# Patient Record
Sex: Female | Born: 1945 | Race: White | Hispanic: No | Marital: Married
Health system: Southern US, Community
[De-identification: ages and names within clinical notes are randomized; demographics above are authoritative.]

---

## 2004-07-30 ENCOUNTER — Ambulatory Visit: Payer: Self-pay

## 2004-10-28 ENCOUNTER — Ambulatory Visit: Payer: Self-pay | Admitting: Gastroenterology

## 2004-11-24 ENCOUNTER — Ambulatory Visit: Payer: Self-pay | Admitting: Gastroenterology

## 2004-12-02 ENCOUNTER — Ambulatory Visit: Payer: Self-pay | Admitting: Gastroenterology

## 2005-01-01 ENCOUNTER — Ambulatory Visit: Payer: Self-pay | Admitting: Internal Medicine

## 2005-02-17 ENCOUNTER — Other Ambulatory Visit: Payer: Self-pay

## 2005-02-17 ENCOUNTER — Inpatient Hospital Stay: Payer: Self-pay | Admitting: Specialist

## 2005-02-22 ENCOUNTER — Inpatient Hospital Stay: Payer: Self-pay | Admitting: Specialist

## 2005-03-05 ENCOUNTER — Ambulatory Visit: Payer: Self-pay | Admitting: Internal Medicine

## 2005-05-26 ENCOUNTER — Ambulatory Visit: Payer: Self-pay | Admitting: Internal Medicine

## 2005-08-04 ENCOUNTER — Ambulatory Visit: Payer: Self-pay

## 2005-11-03 ENCOUNTER — Other Ambulatory Visit: Payer: Self-pay

## 2005-11-03 ENCOUNTER — Ambulatory Visit: Payer: Self-pay

## 2005-12-07 ENCOUNTER — Inpatient Hospital Stay (HOSPITAL_COMMUNITY): Admission: RE | Admit: 2005-12-07 | Discharge: 2005-12-10 | Payer: Self-pay | Admitting: Orthopedic Surgery

## 2006-10-05 ENCOUNTER — Ambulatory Visit: Payer: Self-pay

## 2006-10-19 ENCOUNTER — Ambulatory Visit: Payer: Self-pay | Admitting: Unknown Physician Specialty

## 2006-11-17 ENCOUNTER — Ambulatory Visit: Payer: Self-pay | Admitting: Family Medicine

## 2007-04-18 IMAGING — CT CT OF THE LEFT HIP WITHOUT CONTRAST
2 series · 12 of 32 positions shown, 17 images · non-contrast
Comparison: none

REASON FOR EXAM: Fall
COMMENTS:  LMP: N/A

PROCEDURE:     CT  - CT HIP LEFT WITHOUT CONTRAST  - February 17, 2005  [DATE]
RESULT:     Routine CT scan of the LEFT hip was performed without contrast.

[Series 2: hip 3.0 b70s · axial · 0.35mm/px · z∈[-1486,-1396]mm · 4 of 50 slices shown, 9 images]
[im 10/50  soft-tissue]
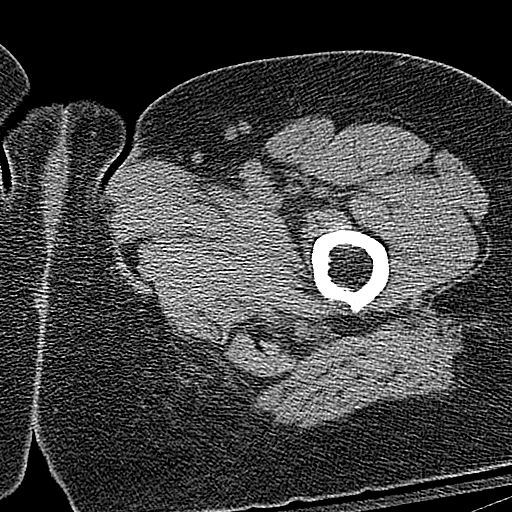
[im 10/50  lung]
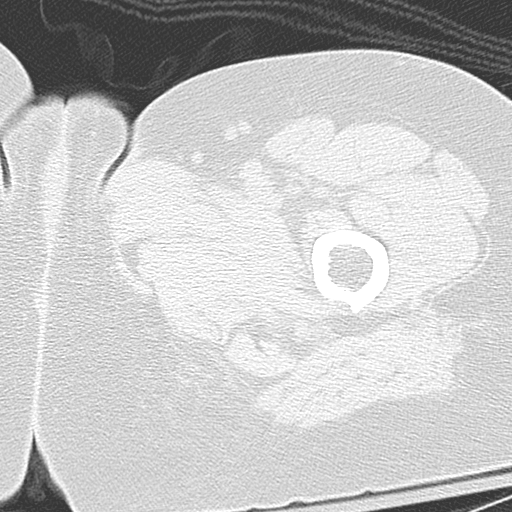
[im 10/50  bone]
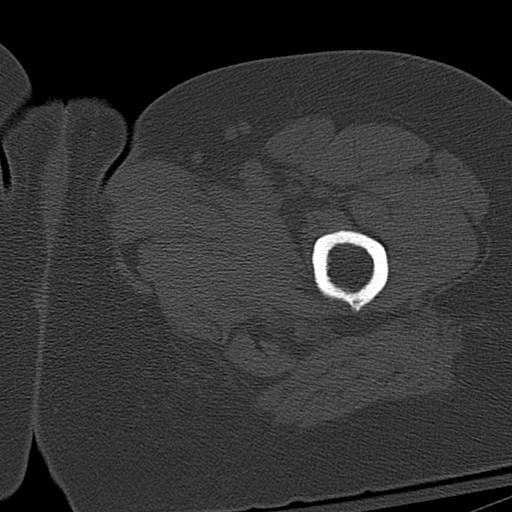
[im 20/50  soft-tissue]
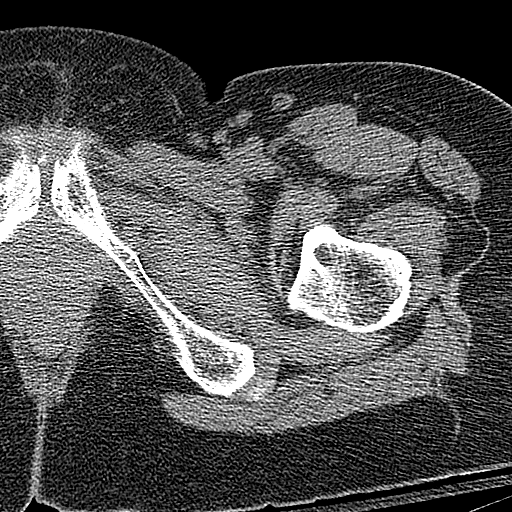
[im 20/50  lung]
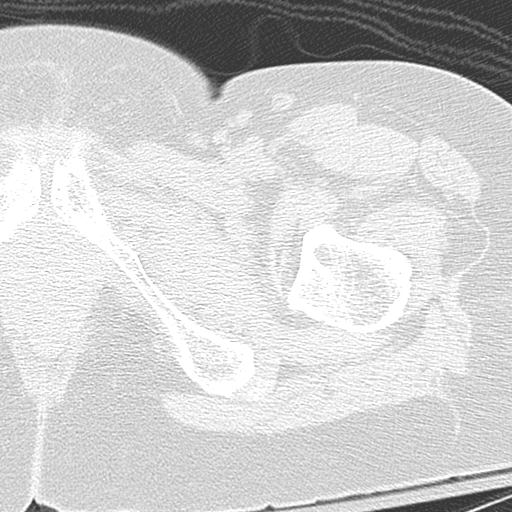
[im 30/50  soft-tissue]
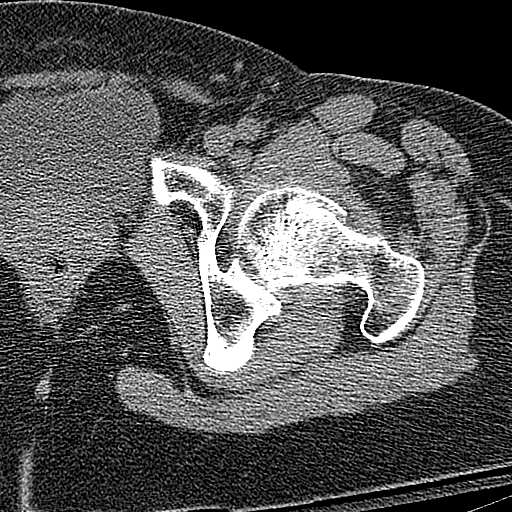
[im 30/50  lung]
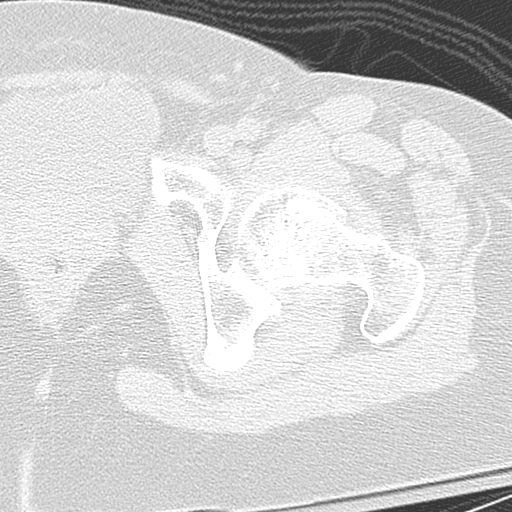
[im 40/50  soft-tissue]
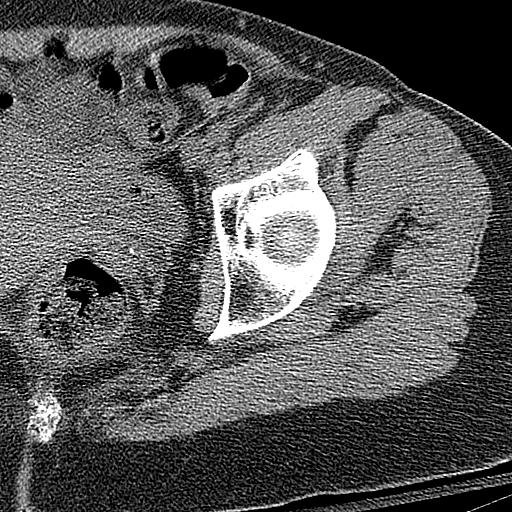
[im 40/50  lung]
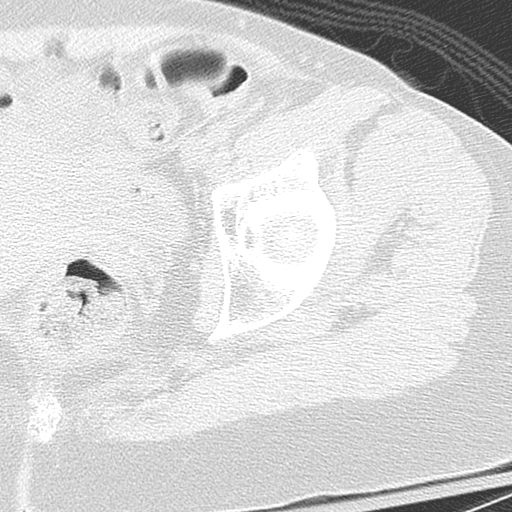

[Series 3: inspace · axial · 0.35mm/px · z∈[-1499,-1374]mm · 8 of 212 slices shown]
[im 17/212  soft-tissue]
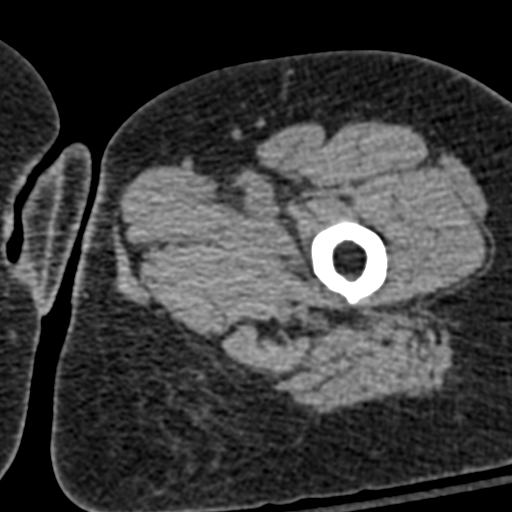
[im 43/212  soft-tissue]
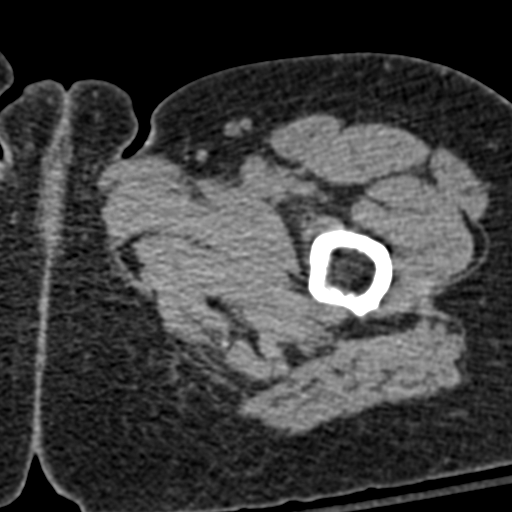
[im 68/212  soft-tissue]
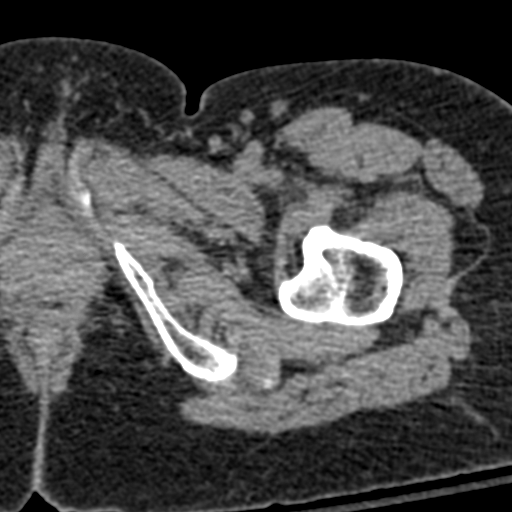
[im 93/212  soft-tissue]
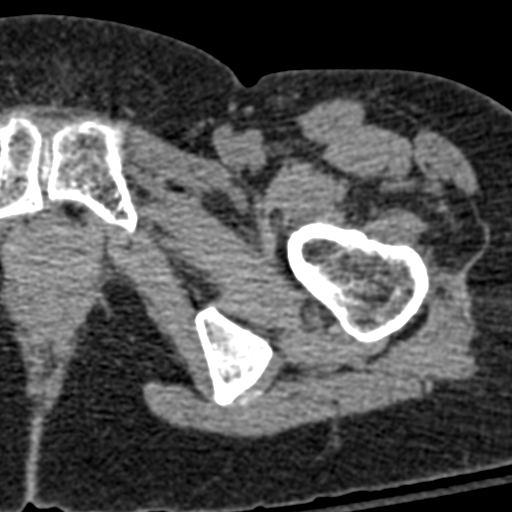
[im 119/212  soft-tissue]
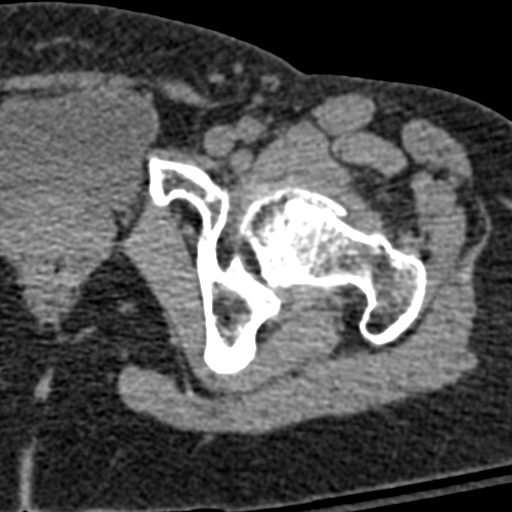
[im 144/212  soft-tissue]
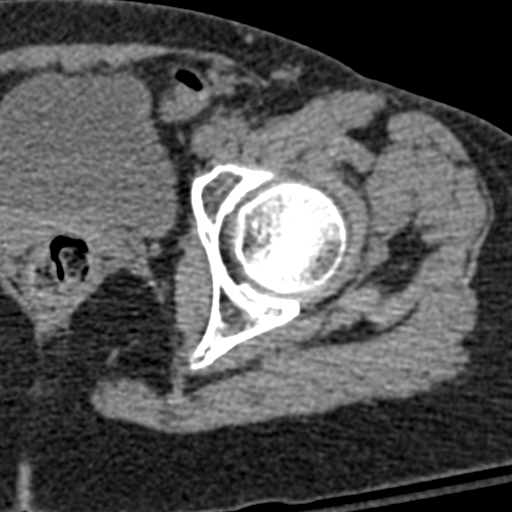
[im 169/212  soft-tissue]
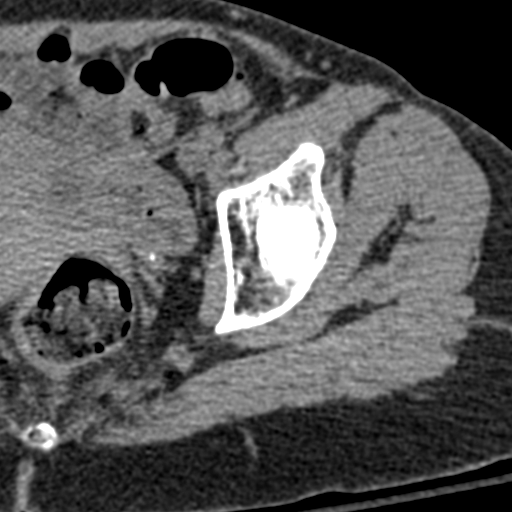
[im 195/212  soft-tissue]
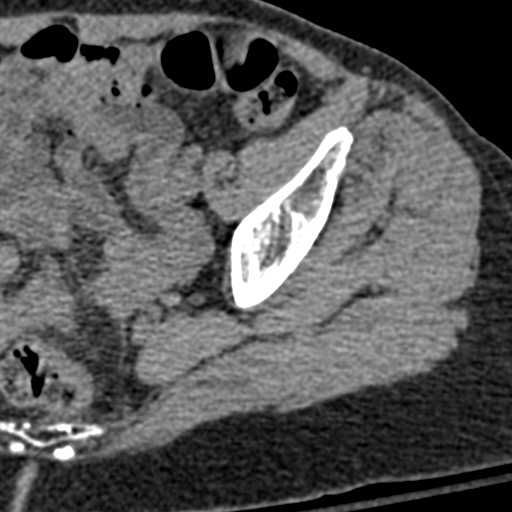

[12 of 32 positions shown; findings below may reference images not displayed]

FINDINGS: There is a fracture in the intertrochanteric to subtrochanteric
region without significant displacement or distraction. The coronal
reconstructions show this to best advantage. The acetabulum appears to be
intact. No pathologic lesion is seen.
IMPRESSION: Subcapital to intertrochanteric LEFT femoral fracture.

## 2007-11-15 ENCOUNTER — Ambulatory Visit: Payer: Self-pay

## 2008-11-15 ENCOUNTER — Ambulatory Visit: Payer: Self-pay

## 2009-12-04 ENCOUNTER — Ambulatory Visit: Payer: Self-pay

## 2010-12-31 ENCOUNTER — Ambulatory Visit: Payer: Self-pay

## 2012-01-05 ENCOUNTER — Ambulatory Visit: Payer: Self-pay

## 2019-01-24 ENCOUNTER — Encounter: Payer: Self-pay | Admitting: *Deleted

## 2019-01-24 ENCOUNTER — Telehealth: Payer: Self-pay | Admitting: *Deleted

## 2019-01-24 NOTE — Progress Notes (Signed)
Received message from triage nurse to call the patient.  Called patient but no answer.  Left message to return my call.

## 2019-01-24 NOTE — Telephone Encounter (Signed)
Patient requests a return call from Drew.

## 2021-01-19 DEATH — deceased
